# Patient Record
Sex: Female | Born: 1955 | Race: White | Hispanic: No | Marital: Married | Smoking: Never smoker
Health system: Southern US, Community
[De-identification: ages and names within clinical notes are randomized; demographics above are authoritative.]

## PROBLEM LIST (undated history)

## (undated) DIAGNOSIS — M5126 Other intervertebral disc displacement, lumbar region: Secondary | ICD-10-CM

## (undated) DIAGNOSIS — R112 Nausea with vomiting, unspecified: Secondary | ICD-10-CM

## (undated) DIAGNOSIS — R519 Headache, unspecified: Secondary | ICD-10-CM

## (undated) DIAGNOSIS — F329 Major depressive disorder, single episode, unspecified: Secondary | ICD-10-CM

## (undated) DIAGNOSIS — F419 Anxiety disorder, unspecified: Secondary | ICD-10-CM

## (undated) DIAGNOSIS — Z9889 Other specified postprocedural states: Secondary | ICD-10-CM

## (undated) DIAGNOSIS — R51 Headache: Secondary | ICD-10-CM

## (undated) DIAGNOSIS — T8859XA Other complications of anesthesia, initial encounter: Secondary | ICD-10-CM

## (undated) DIAGNOSIS — K219 Gastro-esophageal reflux disease without esophagitis: Secondary | ICD-10-CM

## (undated) DIAGNOSIS — T4145XA Adverse effect of unspecified anesthetic, initial encounter: Secondary | ICD-10-CM

## (undated) DIAGNOSIS — S83209A Unspecified tear of unspecified meniscus, current injury, unspecified knee, initial encounter: Secondary | ICD-10-CM

## (undated) DIAGNOSIS — M1990 Unspecified osteoarthritis, unspecified site: Secondary | ICD-10-CM

## (undated) DIAGNOSIS — N6310 Unspecified lump in the right breast, unspecified quadrant: Secondary | ICD-10-CM

## (undated) DIAGNOSIS — F32A Depression, unspecified: Secondary | ICD-10-CM

## (undated) HISTORY — PX: ABDOMINAL HYSTERECTOMY: SHX81

## (undated) HISTORY — PX: TONSILLECTOMY: SUR1361

## (undated) HISTORY — PX: COLONOSCOPY: SHX174

## (undated) HISTORY — PX: LAPAROSCOPY: SHX197

## (undated) HISTORY — PX: RHINOPLASTY: SUR1284

## (undated) HISTORY — PX: DILATION AND CURETTAGE OF UTERUS: SHX78

---

## 1997-12-07 ENCOUNTER — Other Ambulatory Visit: Admission: RE | Admit: 1997-12-07 | Discharge: 1997-12-07 | Payer: Self-pay | Admitting: Obstetrics & Gynecology

## 1999-08-16 ENCOUNTER — Other Ambulatory Visit: Admission: RE | Admit: 1999-08-16 | Discharge: 1999-08-16 | Payer: Self-pay | Admitting: Obstetrics & Gynecology

## 2000-08-29 ENCOUNTER — Other Ambulatory Visit: Admission: RE | Admit: 2000-08-29 | Discharge: 2000-08-29 | Payer: Self-pay | Admitting: Obstetrics & Gynecology

## 2000-08-31 ENCOUNTER — Ambulatory Visit (HOSPITAL_COMMUNITY): Admission: RE | Admit: 2000-08-31 | Discharge: 2000-08-31 | Payer: Self-pay | Admitting: Obstetrics & Gynecology

## 2000-08-31 ENCOUNTER — Encounter (INDEPENDENT_AMBULATORY_CARE_PROVIDER_SITE_OTHER): Payer: Self-pay

## 2001-09-22 ENCOUNTER — Other Ambulatory Visit: Admission: RE | Admit: 2001-09-22 | Discharge: 2001-09-22 | Payer: Self-pay | Admitting: Obstetrics & Gynecology

## 2003-01-12 ENCOUNTER — Other Ambulatory Visit: Admission: RE | Admit: 2003-01-12 | Discharge: 2003-01-12 | Payer: Self-pay | Admitting: Obstetrics & Gynecology

## 2004-04-20 ENCOUNTER — Other Ambulatory Visit: Admission: RE | Admit: 2004-04-20 | Discharge: 2004-04-20 | Payer: Self-pay | Admitting: Obstetrics & Gynecology

## 2007-08-04 ENCOUNTER — Encounter: Admission: RE | Admit: 2007-08-04 | Discharge: 2007-08-04 | Payer: Self-pay | Admitting: Obstetrics & Gynecology

## 2010-07-07 NOTE — Op Note (Signed)
[facility]  Patient:    Janet Wells, Janet Wells                           MRN: [id number] Proc. Date: 08/31/00 Adm. Date:  [id number] Attending:  Minette Headland                           Operative Report  PREOPERATIVE DIAGNOSIS:       Missed abortion.  POSTOPERATIVE DIAGNOSIS:      Missed abortion.  OPERATION:                    Dilation and evacuation.  SURGEON:                      Freddy Finner, M.D.  ANESTHESIA:                   Managed intravenous sedation and paracervical block.  INTRAOPERATIVE COMPLICATIONS:                None.  ESTIMATED INTRAOPERATIVE BLOOD LOSS:                   Less than or equal to 50 cc.  INDICATIONS:                  Patient is a 56 year old who presented to the office approximately 48 hours prior to this admission for routine obstetrical care at 10-1/2 weeks of gestation by dates.  No fetal heart tone was heard at the initial examination and a pelvic ultrasound was obtained which showed a nonviable 7.3 week size pregnancy with a fetus and no fetal heart activity. Patient is now admitted for D&E.  DESCRIPTION OF PROCEDURE:     She was admitted on the morning of surgery, brought to the operating room, placed under adequate intravenous sedation, placed in the dorsal lithotomy position using the [facility] stirrup system. Betadine prep of the perineum and vagina was carried out.  Cervix was visualized, grasped with a single-toothed tenaculum.  One percent plain Xylocaine 10 cc was used to inject at sites at the 4 and 8 oclock positions in the vaginal fornices for paracervical block.  Cervix was progressively dilated to 27 with Pratts.  An 8 mm curved suction cannula was introduced and aspiration preduced obvious obvious parts of conception.  This was continued until it was felt that the cavity was evacuated and this was confirmed by exploration with a small sharp curet and polyp forceps and repeat vacuum aspiration.   The procedure was terminated and the instruments were removed. The patient was taken to the recovery room in good condition and will be discharged per routine outpatient surgical orders to be followed up in approximately one week in the office. DD:  08/31/00 TD:  08/31/00 Job: [id number]

## 2012-01-31 ENCOUNTER — Other Ambulatory Visit: Payer: Self-pay | Admitting: Obstetrics & Gynecology

## 2013-12-07 ENCOUNTER — Other Ambulatory Visit: Payer: Self-pay | Admitting: Obstetrics & Gynecology

## 2013-12-08 LAB — CYTOLOGY - PAP

## 2013-12-16 ENCOUNTER — Other Ambulatory Visit: Payer: Self-pay | Admitting: Obstetrics & Gynecology

## 2013-12-16 DIAGNOSIS — R928 Other abnormal and inconclusive findings on diagnostic imaging of breast: Secondary | ICD-10-CM

## 2013-12-30 ENCOUNTER — Ambulatory Visit
Admission: RE | Admit: 2013-12-30 | Discharge: 2013-12-30 | Disposition: A | Discharge status: Home / Self Care | Payer: 59 | Source: Ambulatory Visit | Attending: Obstetrics & Gynecology | Admitting: Obstetrics & Gynecology

## 2013-12-30 DIAGNOSIS — R928 Other abnormal and inconclusive findings on diagnostic imaging of breast: Secondary | ICD-10-CM

## 2014-01-20 ENCOUNTER — Other Ambulatory Visit: Payer: Self-pay | Admitting: Surgical

## 2014-02-18 NOTE — Patient Instructions (Addendum)
Janet Wells  02/18/2014   Your procedure is scheduled on: 02/26/14   Report to [facility]  Entrance and follow signs to               [place] at 5:30 AM.   Call this number if you have problems the morning of surgery [phone]   Remember:  Do not eat food or drink liquids :After Midnight.     Take these medicines the morning of surgery with A SIP OF WATER: prilosec                               You may not have any metal on your body including hair pins and              piercings  Do not wear jewelry, make-up, lotions, powders or perfumes.             Do not wear nail polish.  Do not shave  48 hours prior to surgery.              Men may shave face and neck.   Do not bring valuables to the hospital. [place].  Contacts, dentures or bridgework may not be worn into surgery.  Leave suitcase in the car. After surgery it may be brought to your room.     Patients discharged the day of surgery will not be allowed to drive home.  Name and phone number of your driver:  Special Instructions: N/A              Please read over the following fact sheets you were given: _____________________________________________________________________                                                     [place]  Before surgery, you can play an important role.  Because skin is not sterile, your skin needs to be as free of germs as possible.  You can reduce the number of germs on your skin by washing with CHG (chlorahexidine gluconate) soap before surgery.  CHG is an antiseptic cleaner which kills germs and bonds with the skin to continue killing germs even after washing. Please DO NOT use if you have an allergy to CHG or antibacterial soaps.  If your skin becomes reddened/irritated stop using the CHG and inform your nurse when you arrive at Short Stay. Do not shave (including legs and  underarms) for at least 48 hours prior to the first CHG shower.  You may shave your face. Please follow these instructions carefully:   1.  Shower with CHG Soap the night before surgery and the  morning of Surgery.   2.  If you choose to wash your hair, wash your hair first as usual with your  normal  Shampoo.   3.  After you shampoo, rinse your hair and body thoroughly to remove the  shampoo.  4.  Use CHG as you would any other liquid soap.  You can apply chg directly  to the skin and wash . Gently wash with scrungie or clean wascloth    5.  Apply the CHG Soap to your body ONLY FROM THE NECK DOWN.   Do not use on open                           Wound or open sores. Avoid contact with eyes, ears mouth and genitals (private parts).                        Genitals (private parts) with your normal soap.              6.  Wash thoroughly, paying special attention to the area where your surgery  will be performed.   7.  Thoroughly rinse your body with warm water from the neck down.   8.  DO NOT shower/wash with your normal soap after using and rinsing off  the CHG Soap .                9.  Pat yourself dry with a clean towel.             10.  Wear clean pajamas.             11.  Place clean sheets on your bed the night of your first shower and do not  sleep with pets.  Day of Surgery : Do not apply any lotions/deodorants the morning of surgery.  Please wear clean clothes to the hospital/surgery center.  FAILURE TO FOLLOW THESE INSTRUCTIONS MAY RESULT IN THE CANCELLATION OF YOUR SURGERY    PATIENT SIGNATURE_________________________________  ______________________________________________________________________

## 2014-02-22 ENCOUNTER — Encounter (HOSPITAL_COMMUNITY): Payer: Self-pay

## 2014-02-22 ENCOUNTER — Encounter (HOSPITAL_COMMUNITY)
Admission: RE | Admit: 2014-02-22 | Discharge: 2014-02-22 | Disposition: A | Discharge status: Home / Self Care | Payer: 59 | Source: Ambulatory Visit | Attending: Orthopedic Surgery | Admitting: Orthopedic Surgery

## 2014-02-22 DIAGNOSIS — Z01812 Encounter for preprocedural laboratory examination: Secondary | ICD-10-CM | POA: Insufficient documentation

## 2014-02-22 HISTORY — DX: Anxiety disorder, unspecified: F41.9

## 2014-02-22 HISTORY — DX: Other complications of anesthesia, initial encounter: T88.59XA

## 2014-02-22 HISTORY — DX: Adverse effect of unspecified anesthetic, initial encounter: T41.45XA

## 2014-02-22 HISTORY — DX: Headache, unspecified: R51.9

## 2014-02-22 HISTORY — DX: Unspecified tear of unspecified meniscus, current injury, unspecified knee, initial encounter: S83.209A

## 2014-02-22 HISTORY — DX: Other intervertebral disc displacement, lumbar region: M51.26

## 2014-02-22 HISTORY — DX: Headache: R51

## 2014-02-22 HISTORY — DX: Depression, unspecified: F32.A

## 2014-02-22 HISTORY — DX: Major depressive disorder, single episode, unspecified: F32.9

## 2014-02-22 HISTORY — DX: Gastro-esophageal reflux disease without esophagitis: K21.9

## 2014-02-22 HISTORY — DX: Unspecified osteoarthritis, unspecified site: M19.90

## 2014-02-22 LAB — CBC
HEMATOCRIT: 36.9 % (ref 36.0–46.0)
Hemoglobin: 11.4 g/dL — ABNORMAL LOW (ref 12.0–15.0)
MCH: 27.5 pg (ref 26.0–34.0)
MCHC: 30.9 g/dL (ref 30.0–36.0)
MCV: 88.9 fL (ref 78.0–100.0)
Platelets: 256 10*3/uL (ref 150–400)
RBC: 4.15 MIL/uL (ref 3.87–5.11)
RDW: 13.3 % (ref 11.5–15.5)
WBC: 4.4 10*3/uL (ref 4.0–10.5)

## 2014-02-22 LAB — HCG, SERUM, QUALITATIVE: Preg, Serum: NEGATIVE

## 2014-02-26 ENCOUNTER — Ambulatory Visit (HOSPITAL_COMMUNITY): Payer: 59 | Admitting: Anesthesiology

## 2014-02-26 ENCOUNTER — Encounter (HOSPITAL_COMMUNITY): Payer: Self-pay | Admitting: *Deleted

## 2014-02-26 ENCOUNTER — Encounter (HOSPITAL_COMMUNITY)
Admission: RE | Disposition: A | Discharge status: Home / Self Care | Payer: Self-pay | Source: Ambulatory Visit | Attending: Orthopedic Surgery

## 2014-02-26 ENCOUNTER — Ambulatory Visit (HOSPITAL_COMMUNITY)
Admission: RE | Admit: 2014-02-26 | Discharge: 2014-02-26 | Disposition: A | Discharge status: Home / Self Care | Payer: 59 | Source: Ambulatory Visit | Attending: Orthopedic Surgery | Admitting: Orthopedic Surgery

## 2014-02-26 DIAGNOSIS — M25562 Pain in left knee: Secondary | ICD-10-CM | POA: Diagnosis present

## 2014-02-26 DIAGNOSIS — F419 Anxiety disorder, unspecified: Secondary | ICD-10-CM | POA: Insufficient documentation

## 2014-02-26 DIAGNOSIS — F329 Major depressive disorder, single episode, unspecified: Secondary | ICD-10-CM | POA: Diagnosis not present

## 2014-02-26 DIAGNOSIS — K219 Gastro-esophageal reflux disease without esophagitis: Secondary | ICD-10-CM | POA: Diagnosis not present

## 2014-02-26 DIAGNOSIS — M1990 Unspecified osteoarthritis, unspecified site: Secondary | ICD-10-CM | POA: Insufficient documentation

## 2014-02-26 DIAGNOSIS — S83207A Unspecified tear of unspecified meniscus, current injury, left knee, initial encounter: Secondary | ICD-10-CM | POA: Insufficient documentation

## 2014-02-26 DIAGNOSIS — Z885 Allergy status to narcotic agent status: Secondary | ICD-10-CM | POA: Insufficient documentation

## 2014-02-26 HISTORY — PX: KNEE ARTHROSCOPY WITH MEDIAL MENISECTOMY: SHX5651

## 2014-02-26 SURGERY — ARTHROSCOPY, KNEE, WITH MEDIAL MENISCECTOMY
Anesthesia: General | Site: Knee | Laterality: Left

## 2014-02-26 MED ORDER — LACTATED RINGERS IR SOLN
Status: DC | PRN
  Administered 2014-02-26 (×2): 3000 mL

## 2014-02-26 MED ORDER — ONDANSETRON HCL 4 MG/2ML IJ SOLN
INTRAMUSCULAR | Status: DC | PRN
  Administered 2014-02-26: 4 mg via INTRAVENOUS

## 2014-02-26 MED ORDER — BUPIVACAINE-EPINEPHRINE 0.25% -1:200000 IJ SOLN
INTRAMUSCULAR | Status: DC | PRN
  Administered 2014-02-26: 390 mL

## 2014-02-26 MED ORDER — LACTATED RINGERS IV SOLN
INTRAVENOUS | Status: DC
  Administered 2014-02-26: 07:00:00 via INTRAVENOUS

## 2014-02-26 MED ORDER — CEFAZOLIN SODIUM-DEXTROSE 2-3 GM-% IV SOLR
INTRAVENOUS | Status: AC
  Filled 2014-02-26: qty 50

## 2014-02-26 MED ORDER — ONDANSETRON HCL 4 MG/2ML IJ SOLN
INTRAMUSCULAR | Status: AC
  Filled 2014-02-26: qty 2

## 2014-02-26 MED ORDER — MIDAZOLAM HCL 2 MG/2ML IJ SOLN
INTRAMUSCULAR | Status: AC
  Filled 2014-02-26: qty 2

## 2014-02-26 MED ORDER — MIDAZOLAM HCL 5 MG/5ML IJ SOLN
INTRAMUSCULAR | Status: DC | PRN
  Administered 2014-02-26: 2 mg via INTRAVENOUS

## 2014-02-26 MED ORDER — PROMETHAZINE HCL 25 MG/ML IJ SOLN
6.2500 mg | INTRAMUSCULAR | Status: DC | PRN
  Administered 2014-02-26: 6.25 mg via INTRAVENOUS
  Filled 2014-02-26: qty 1

## 2014-02-26 MED ORDER — ONDANSETRON HCL 4 MG/2ML IJ SOLN
4.0000 mg | Freq: Once | INTRAMUSCULAR | Status: AC
  Administered 2014-02-26: 4 mg via INTRAVENOUS
  Filled 2014-02-26: qty 2

## 2014-02-26 MED ORDER — CEFAZOLIN SODIUM-DEXTROSE 2-3 GM-% IV SOLR
2.0000 g | INTRAVENOUS | Status: AC
  Administered 2014-02-26: 2 g via INTRAVENOUS

## 2014-02-26 MED ORDER — FENTANYL CITRATE 0.05 MG/ML IJ SOLN
INTRAMUSCULAR | Status: AC
  Filled 2014-02-26: qty 2

## 2014-02-26 MED ORDER — FENTANYL CITRATE 0.05 MG/ML IJ SOLN
INTRAMUSCULAR | Status: DC | PRN
  Administered 2014-02-26: 100 ug via INTRAVENOUS

## 2014-02-26 MED ORDER — LIDOCAINE HCL (CARDIAC) 20 MG/ML IV SOLN
INTRAVENOUS | Status: AC
  Filled 2014-02-26: qty 5

## 2014-02-26 MED ORDER — DEXAMETHASONE SODIUM PHOSPHATE 10 MG/ML IJ SOLN
INTRAMUSCULAR | Status: DC | PRN
  Administered 2014-02-26: 10 mg via INTRAVENOUS

## 2014-02-26 MED ORDER — BUPIVACAINE-EPINEPHRINE (PF) 0.25% -1:200000 IJ SOLN
INTRAMUSCULAR | Status: AC
  Filled 2014-02-26: qty 30

## 2014-02-26 MED ORDER — OXYCODONE-ACETAMINOPHEN 5-325 MG PO TABS: 1.0000 | ORAL_TABLET | ORAL | Status: DC | PRN

## 2014-02-26 MED ORDER — PROPOFOL 10 MG/ML IV BOLUS
INTRAVENOUS | Status: AC
  Filled 2014-02-26: qty 20

## 2014-02-26 MED ORDER — MEPERIDINE HCL 50 MG/ML IJ SOLN: 6.2500 mg | INTRAMUSCULAR | Status: DC | PRN

## 2014-02-26 MED ORDER — PROPOFOL 10 MG/ML IV BOLUS
INTRAVENOUS | Status: DC | PRN
  Administered 2014-02-26: 170 mg via INTRAVENOUS

## 2014-02-26 MED ORDER — OXYCODONE-ACETAMINOPHEN 10-325 MG PO TABS: 1.0000 | ORAL_TABLET | ORAL | Status: DC | PRN

## 2014-02-26 MED ORDER — DEXAMETHASONE SODIUM PHOSPHATE 10 MG/ML IJ SOLN
INTRAMUSCULAR | Status: AC
  Filled 2014-02-26: qty 1

## 2014-02-26 MED ORDER — HYDROMORPHONE HCL 1 MG/ML IJ SOLN
INTRAMUSCULAR | Status: AC
  Filled 2014-02-26: qty 1

## 2014-02-26 MED ORDER — BACITRACIN ZINC 500 UNIT/GM EX OINT
TOPICAL_OINTMENT | CUTANEOUS | Status: DC | PRN
  Administered 2014-02-26: 1 via TOPICAL

## 2014-02-26 MED ORDER — HYDROMORPHONE HCL 1 MG/ML IJ SOLN
0.2500 mg | INTRAMUSCULAR | Status: DC | PRN
  Administered 2014-02-26 (×4): 0.5 mg via INTRAVENOUS

## 2014-02-26 MED ORDER — BACITRACIN ZINC 500 UNIT/GM EX OINT
TOPICAL_OINTMENT | CUTANEOUS | Status: AC
  Filled 2014-02-26: qty 28.35

## 2014-02-26 MED ORDER — LIDOCAINE HCL (CARDIAC) 20 MG/ML IV SOLN
INTRAVENOUS | Status: DC | PRN
  Administered 2014-02-26: 100 mg via INTRAVENOUS

## 2014-02-26 SURGICAL SUPPLY — 31 items
BANDAGE ELASTIC 4 VELCRO ST LF (GAUZE/BANDAGES/DRESSINGS) ×3 IMPLANT
BANDAGE ELASTIC 6 VELCRO ST LF (GAUZE/BANDAGES/DRESSINGS) ×3 IMPLANT
BLADE GREAT WHITE 4.2 (BLADE) ×2 IMPLANT
BLADE GREAT WHITE 4.2MM (BLADE) ×1
BLADE SURG SZ11 CARB STEEL (BLADE) IMPLANT
BNDG COHESIVE 6X5 TAN STRL LF (GAUZE/BANDAGES/DRESSINGS) ×3 IMPLANT
BNDG GAUZE ELAST 4 BULKY (GAUZE/BANDAGES/DRESSINGS) ×3 IMPLANT
CLOTH BEACON ORANGE TIMEOUT ST (SAFETY) ×3 IMPLANT
COUNTER NEEDLE 20 DBL MAG RED (NEEDLE) ×3 IMPLANT
DRAPE SHEET LG 3/4 BI-LAMINATE (DRAPES) ×3 IMPLANT
DRSG ADAPTIC 3X8 NADH LF (GAUZE/BANDAGES/DRESSINGS) ×3 IMPLANT
DRSG PAD ABDOMINAL 8X10 ST (GAUZE/BANDAGES/DRESSINGS) IMPLANT
DURAPREP 26ML APPLICATOR (WOUND CARE) ×3 IMPLANT
GLOVE BIOGEL PI IND STRL 8 (GLOVE) ×1 IMPLANT
GLOVE BIOGEL PI INDICATOR 8 (GLOVE) ×2
GLOVE ECLIPSE 8.0 STRL XLNG CF (GLOVE) ×3 IMPLANT
GOWN STRL REUS W/TWL XL LVL3 (GOWN DISPOSABLE) ×3 IMPLANT
KIT BASIN OR (CUSTOM PROCEDURE TRAY) ×3 IMPLANT
MANIFOLD NEPTUNE II (INSTRUMENTS) ×3 IMPLANT
PACK ARTHROSCOPY WL (CUSTOM PROCEDURE TRAY) ×3 IMPLANT
PACK ICE MAXI GEL EZY WRAP (MISCELLANEOUS) ×3 IMPLANT
PAD ABD 8X10 STRL (GAUZE/BANDAGES/DRESSINGS) ×9 IMPLANT
PAD MASON LEG HOLDER (PIN) ×3 IMPLANT
SET ARTHROSCOPY TUBING (MISCELLANEOUS) ×2
SET ARTHROSCOPY TUBING LN (MISCELLANEOUS) ×1 IMPLANT
SUT ETHILON 3 0 PS 1 (SUTURE) ×3 IMPLANT
TOWEL OR 17X26 10 PK STRL BLUE (TOWEL DISPOSABLE) ×3 IMPLANT
TUBING CONNECTING 10 (TUBING) ×2 IMPLANT
TUBING CONNECTING 10' (TUBING) ×1
WAND 90 DEG TURBOVAC W/CORD (SURGICAL WAND) ×3 IMPLANT
WRAP KNEE MAXI GEL POST OP (GAUZE/BANDAGES/DRESSINGS) ×3 IMPLANT

## 2014-02-26 NOTE — H&P (Signed)
Janet Wells is an 59 y.o. female.   Chief Complaint: pain Left Knee HPI: Progressive pain in Left Knee  Past Medical History  Diagnosis Date  . Complication of anesthesia   . Headache     hx migraines  . Meniscus tear     l knee  . Ruptured lumbar disc   . Arthritis   . GERD (gastroesophageal reflux disease)   . Depression   . Anxiety     Past Surgical History  Procedure Laterality Date  . Tonsillectomy    . Rhinoplasty  1980's  . Laparoscopy      for endometriosis  . Cesarean section    . Dilation and curettage of uterus      x3  . Colonoscopy      History reviewed. No pertinent family history. Social History:  reports that she has never smoked. She does not have any smokeless tobacco history on file. She reports that she drinks alcohol. She reports that she does not use illicit drugs.  Allergies:  Allergies  Allergen Reactions  . Vicodin [Hydrocodone-Acetaminophen]     Mouth itch    Medications Prior to Admission  Medication Sig Dispense Refill  . aspirin-acetaminophen-caffeine (EXCEDRIN MIGRAINE) 250-250-65 MG per tablet Take 1 tablet by mouth every 6 (six) hours as needed for headache.    Marland Kitchen MINIVELLE 0.05 MG/24HR patch Place 1 patch onto the skin 2 (two) times a week.   12  . Multiple Vitamin (MULTIVITAMIN WITH MINERALS) TABS tablet Take 1 tablet by mouth daily.    . naproxen sodium (ANAPROX) 220 MG tablet Take 220 mg by mouth 2 (two) times daily as needed (for pain).    Marland Kitchen omeprazole (PRILOSEC) 20 MG capsule Take 20 mg by mouth daily.   11  . progesterone (PROMETRIUM) 200 MG capsule Take 200 mg by mouth every 3 (three) months.   0    No results found for this or any previous visit (from the past 48 hour(s)). No results found.  Review of Systems  Constitutional: Negative.   HENT: Negative.   Eyes: Negative.   Respiratory: Negative.   Cardiovascular: Negative.   Gastrointestinal: Negative.   Genitourinary: Negative.   Musculoskeletal: Positive for joint  pain.  Skin: Negative.   Neurological: Negative.   Endo/Heme/Allergies: Negative.   Psychiatric/Behavioral: Negative.     Blood pressure 145/75, pulse 82, temperature 98.3 F (36.8 C), temperature source Oral, resp. rate 18, height 5' 6.5" (1.689 m), weight 97.977 kg (216 lb), last menstrual period 01/06/2014, SpO2 100 %. Physical Exam  Constitutional: She appears well-developed.  HENT:  Head: Normocephalic.  Eyes: Pupils are equal, round, and reactive to light.  Neck: Normal range of motion.  Cardiovascular: Normal rate.   Respiratory: Effort normal.  GI: Soft.  Musculoskeletal:  Pain,Popping and Swelling of Left Knee.  Skin: Skin is warm.     Assessment/Plan Left Knee Menisectomy.  Aliviah Spain A 02/26/2014, 7:03 AM

## 2014-02-26 NOTE — Brief Op Note (Signed)
02/26/2014  8:12 AM  PATIENT:  Golden Hurter  59 y.o. female  PRE-OPERATIVE DIAGNOSIS:  left knee medial meniscus tear  POST-OPERATIVE DIAGNOSIS:  Primary Osteoarthritis of Left Knee.r  PROCEDURE:  Procedure(s): LEFT KNEE ARTHROSCOPY WITH ABRASION CHONDROPLASTY OF THE PATELLA  AND MEDIAL FEMORAL CHONDYLE MICROFRACTURE TECHNIQUE (Left)  SURGEON:  Surgeon(s) and Role:    * Tobi Bastos, MD - Primary    ASSISTANTS: OR Tech  ANESTHESIA:   general  EBL:  Total I/O In: 600 [I.V.:600] Out: -   BLOOD ADMINISTERED:none  DRAINS: none   LOCAL MEDICATIONS USED:  MARCAINE 20cc of 0.25%with Epinephrine   SPECIMEN:  No Specimen  DISPOSITION OF SPECIMEN:  N/A  COUNTS:  YES  TOURNIQUET:  * No tourniquets in log *  DICTATION: .Other Dictation: Dictation Number [id number]  PLAN OF CARE: Discharge to home after PACU  PATIENT DISPOSITION:  PACU - hemodynamically stable.   Delay start of Pharmacological VTE agent (>24hrs) due to surgical blood loss or risk of bleeding: yes

## 2014-02-26 NOTE — Interval H&P Note (Signed)
History and Physical Interval Note:  02/26/2014 7:06 AM  Janet Wells  has presented today for surgery, with the diagnosis of left knee medial meniscus tear  The various methods of treatment have been discussed with the patient and family. After consideration of risks, benefits and other options for treatment, the patient has consented to  Procedure(s): LEFT KNEE ARTHROSCOPY WITH MEDIAL MENISECTOMY (Left) as a surgical intervention .  The patient's history has been reviewed, patient examined, no change in status, stable for surgery.  I have reviewed the patient's chart and labs.  Questions were answered to the patient's satisfaction.     Avary Eichenberger A

## 2014-02-26 NOTE — Transfer of Care (Signed)
Immediate Anesthesia Transfer of Care Note  Patient: Janet Wells  Procedure(s) Performed: Procedure(s): LEFT KNEE ARTHROSCOPY WITH ABRASION CHONDROPLASTY OF THE PATELLA  AND MEDIAL FEMORAL CHONDYLE MICROFRACTURE TECHNIQUE (Left)  Patient Location: PACU  Anesthesia Type:General  Level of Consciousness: sedated  Airway & Oxygen Therapy: Patient Spontanous Breathing and Patient connected to face mask oxygen  Post-op Assessment: Report given to PACU RN and Post -op Vital signs reviewed and stable  Post vital signs: Reviewed and stable  Complications: No apparent anesthesia complications

## 2014-02-26 NOTE — Progress Notes (Signed)
Still slightly queezy. Small amy phenergan given to help w ride home

## 2014-02-26 NOTE — Anesthesia Preprocedure Evaluation (Signed)
Anesthesia Evaluation  Patient identified by MRN, date of birth, ID band Patient awake    Reviewed: Allergy & Precautions, NPO status , Patient's Chart, lab work & pertinent test results  Airway Mallampati: II  TM Distance: >3 FB Neck ROM: Full    Dental no notable dental hx.    Pulmonary neg pulmonary ROS,  breath sounds clear to auscultation  Pulmonary exam normal       Cardiovascular negative cardio ROS  Rhythm:Regular Rate:Normal     Neuro/Psych negative neurological ROS  negative psych ROS   GI/Hepatic Neg liver ROS, GERD-  ,  Endo/Other  negative endocrine ROS  Renal/GU negative Renal ROS     Musculoskeletal negative musculoskeletal ROS (+) Arthritis -,   Abdominal   Peds  Hematology negative hematology ROS (+)   Anesthesia Other Findings   Reproductive/Obstetrics negative OB ROS                             Anesthesia Physical Anesthesia Plan  ASA: II  Anesthesia Plan:    Post-op Pain Management:    Induction: Intravenous  Airway Management Planned:   Additional Equipment:   Intra-op Plan:   Post-operative Plan:   Informed Consent: I have reviewed the patients History and Physical, chart, labs and discussed the procedure including the risks, benefits and alternatives for the proposed anesthesia with the patient or authorized representative who has indicated his/her understanding and acceptance.   Dental advisory given  Plan Discussed with: CRNA  Anesthesia Plan Comments:         Anesthesia Quick Evaluation

## 2014-02-26 NOTE — Op Note (Signed)
NAMETYESHIA, CORNFORTH                  ACCOUNT NO.:  [id number]  MEDICAL RECORD NO.:  [id number]  LOCATION:  [facility]                         FACILITY:  [facility]  PHYSICIAN:  Kipp Brood. Galya Dunnigan, M.D.DATE OF BIRTH:  December 09, 1955  DATE OF PROCEDURE:  02/26/2014 DATE OF DISCHARGE:                              OPERATIVE REPORT   PREOPERATIVE DIAGNOSES: 1. Primary osteoarthritis, left knee. 2. Rule out possibility of torn medial meniscus, left knee.  POSTOPERATIVE DIAGNOSES:  Primary osteoarthritis of the left knee.  OPERATION: 1. Diagnostic arthroscopy, left knee. 2. Abrasion chondroplasty, medial femoral condyle, left knee. 3. Microfracture technique, medial femoral condyle, left knee. 4. Abrasion chondroplasty of the patella, left knee. 5. Synovectomy suprapatellar pouch, left knee.  DESCRIPTION OF PROCEDURE:  Under general anesthesia, routine orthopedic prep and draping of the left lower extremity was carried out with the left leg in a knee holder.  The appropriate time-out was called out first and also marked the appropriate left leg in the holding area. Following that, a small incision was made in suprapatellar pouch. Inflow cannula was inserted.  Knee was distended with saline.  Another small punctate incision made in the anterolateral joint.  The arthroscope was entered from lateral approach and a complete diagnostic arthroscopy was carried out.  The lateral joint looked fine.  There were no signs of any meniscal tears.  Cruciates were intact.  I probed the medial meniscus, it was intact.  The main issue that we shown really on the MRI but she had a large flap of articular cartilage of the distal femur, medial femoral condyle, literally hanging off like orange peel. I simply did an abrasion chondroplasty across that area.  I then utilized a microfracture technique with several holes made in the bone with the instrument.  Following that, I went up into the suprapatellar pouch, did a  synovectomy, and then noted a significant chondromalacia of the patella.  I did an abrasion chondroplasty of the patella as well as synovectomy.  I thoroughly irrigated out the knee, removed the fluid, and closed all 3 punctate incisions with 3-0 nylon suture.  Sterile Neosporin dressings were applied after I injected 20 mL it should be of 0.25% Marcaine with epinephrine in knee joint.  Postop, she will be on aspirin 2 a day starting today as an anticoagulant.  We will keep a close eye on her as far as her postop activities and she will be on Percocet 10/325 for pain and crutches.  Partial weightbearing as tolerated.         ______________________________ Kipp Brood. Gladstone Lighter, M.D.    RAG/MEDQ  D:  02/26/2014  T:  02/26/2014  Job:  [id number]

## 2014-02-26 NOTE — Progress Notes (Signed)
Not much relief of nausea call to dr Lissa Hoard for PRN

## 2014-02-26 NOTE — Progress Notes (Signed)
States feeling a little better, but not completely relieved. Does not want additional meds at present

## 2014-02-26 NOTE — Anesthesia Postprocedure Evaluation (Signed)
Anesthesia Post Note  Patient: OTHA RICKLES  Procedure(s) Performed: Procedure(s) (LRB): LEFT KNEE ARTHROSCOPY WITH ABRASION CHONDROPLASTY OF THE PATELLA  AND MEDIAL FEMORAL CHONDYLE MICROFRACTURE TECHNIQUE (Left)  Anesthesia type: General  Patient location: PACU  Post pain: Pain level controlled  Post assessment: Post-op Vital signs reviewed  Last Vitals: BP 112/59 mmHg  Pulse 61  Temp(Src) 37 C (Oral)  Resp 18  Ht 5' 6.5" (1.689 m)  Wt 216 lb (97.977 kg)  BMI 34.35 kg/m2  SpO2 100%  LMP 01/06/2014 (Approximate)  Post vital signs: Reviewed  Level of consciousness: sedated  Complications: No apparent anesthesia complications

## 2014-02-26 NOTE — Progress Notes (Signed)
Patient began w waves of nausea. Cool cloth to forehead and alcohol swap to nose.

## 2014-03-01 ENCOUNTER — Encounter (HOSPITAL_COMMUNITY): Payer: Self-pay | Admitting: Orthopedic Surgery

## 2014-12-27 ENCOUNTER — Other Ambulatory Visit: Payer: Self-pay | Admitting: Obstetrics & Gynecology

## 2014-12-27 DIAGNOSIS — R928 Other abnormal and inconclusive findings on diagnostic imaging of breast: Secondary | ICD-10-CM

## 2015-01-03 ENCOUNTER — Other Ambulatory Visit: Payer: Self-pay | Admitting: Obstetrics & Gynecology

## 2015-01-03 ENCOUNTER — Ambulatory Visit
Admission: RE | Admit: 2015-01-03 | Discharge: 2015-01-03 | Disposition: A | Discharge status: Home / Self Care | Payer: 59 | Source: Ambulatory Visit | Attending: Obstetrics & Gynecology | Admitting: Obstetrics & Gynecology

## 2015-01-03 DIAGNOSIS — N6002 Solitary cyst of left breast: Secondary | ICD-10-CM

## 2015-01-03 DIAGNOSIS — R928 Other abnormal and inconclusive findings on diagnostic imaging of breast: Secondary | ICD-10-CM

## 2015-12-21 HISTORY — PX: BREAST BIOPSY: SHX20

## 2016-01-05 ENCOUNTER — Other Ambulatory Visit: Payer: Self-pay | Admitting: Obstetrics & Gynecology

## 2016-01-05 DIAGNOSIS — R928 Other abnormal and inconclusive findings on diagnostic imaging of breast: Secondary | ICD-10-CM

## 2016-01-16 ENCOUNTER — Ambulatory Visit
Admission: RE | Admit: 2016-01-16 | Discharge: 2016-01-16 | Disposition: A | Discharge status: Home / Self Care | Payer: Managed Care, Other (non HMO) | Source: Ambulatory Visit | Attending: Obstetrics & Gynecology | Admitting: Obstetrics & Gynecology

## 2016-01-16 ENCOUNTER — Other Ambulatory Visit: Payer: Self-pay | Admitting: Obstetrics & Gynecology

## 2016-01-16 DIAGNOSIS — R229 Localized swelling, mass and lump, unspecified: Principal | ICD-10-CM

## 2016-01-16 DIAGNOSIS — R928 Other abnormal and inconclusive findings on diagnostic imaging of breast: Secondary | ICD-10-CM

## 2016-01-16 DIAGNOSIS — IMO0002 Reserved for concepts with insufficient information to code with codable children: Secondary | ICD-10-CM

## 2016-01-16 DIAGNOSIS — N6489 Other specified disorders of breast: Secondary | ICD-10-CM

## 2016-01-17 ENCOUNTER — Ambulatory Visit
Admission: RE | Admit: 2016-01-17 | Discharge: 2016-01-17 | Disposition: A | Discharge status: Home / Self Care | Payer: Managed Care, Other (non HMO) | Source: Ambulatory Visit | Attending: Obstetrics & Gynecology | Admitting: Obstetrics & Gynecology

## 2016-01-17 DIAGNOSIS — N6489 Other specified disorders of breast: Secondary | ICD-10-CM

## 2016-02-01 ENCOUNTER — Ambulatory Visit: Payer: Self-pay | Admitting: General Surgery

## 2016-02-01 DIAGNOSIS — N6021 Fibroadenosis of right breast: Secondary | ICD-10-CM

## 2016-02-20 HISTORY — PX: BREAST EXCISIONAL BIOPSY: SUR124

## 2016-03-01 ENCOUNTER — Other Ambulatory Visit: Payer: Self-pay | Admitting: General Surgery

## 2016-03-01 DIAGNOSIS — N6021 Fibroadenosis of right breast: Secondary | ICD-10-CM

## 2016-03-06 ENCOUNTER — Encounter (HOSPITAL_BASED_OUTPATIENT_CLINIC_OR_DEPARTMENT_OTHER): Payer: Self-pay | Admitting: *Deleted

## 2016-03-09 ENCOUNTER — Ambulatory Visit
Admission: RE | Admit: 2016-03-09 | Discharge: 2016-03-09 | Disposition: A | Discharge status: Home / Self Care | Payer: Managed Care, Other (non HMO) | Source: Ambulatory Visit | Attending: General Surgery | Admitting: General Surgery

## 2016-03-09 DIAGNOSIS — N6021 Fibroadenosis of right breast: Secondary | ICD-10-CM

## 2016-03-09 NOTE — Pre-Procedure Instructions (Signed)
Boost Breeze 8 oz. given to pt. with instructions to drink by 0730 DOS; pt. voiced understanding. 

## 2016-03-12 ENCOUNTER — Ambulatory Visit (HOSPITAL_BASED_OUTPATIENT_CLINIC_OR_DEPARTMENT_OTHER)
Admission: RE | Admit: 2016-03-12 | Discharge: 2016-03-12 | Disposition: A | Discharge status: Home / Self Care | Payer: Managed Care, Other (non HMO) | Source: Ambulatory Visit | Attending: General Surgery | Admitting: General Surgery

## 2016-03-12 ENCOUNTER — Ambulatory Visit (HOSPITAL_BASED_OUTPATIENT_CLINIC_OR_DEPARTMENT_OTHER): Payer: Managed Care, Other (non HMO) | Admitting: Certified Registered"

## 2016-03-12 ENCOUNTER — Encounter (HOSPITAL_BASED_OUTPATIENT_CLINIC_OR_DEPARTMENT_OTHER)
Admission: RE | Disposition: A | Discharge status: Home / Self Care | Payer: Self-pay | Source: Ambulatory Visit | Attending: General Surgery

## 2016-03-12 ENCOUNTER — Ambulatory Visit
Admission: RE | Admit: 2016-03-12 | Discharge: 2016-03-12 | Disposition: A | Discharge status: Home / Self Care | Payer: Managed Care, Other (non HMO) | Source: Ambulatory Visit | Attending: General Surgery | Admitting: General Surgery

## 2016-03-12 DIAGNOSIS — N6021 Fibroadenosis of right breast: Secondary | ICD-10-CM

## 2016-03-12 DIAGNOSIS — N6310 Unspecified lump in the right breast, unspecified quadrant: Secondary | ICD-10-CM | POA: Diagnosis present

## 2016-03-12 DIAGNOSIS — D225 Melanocytic nevi of trunk: Secondary | ICD-10-CM | POA: Diagnosis not present

## 2016-03-12 HISTORY — DX: Unspecified lump in the right breast, unspecified quadrant: N63.10

## 2016-03-12 HISTORY — PX: BREAST LUMPECTOMY WITH RADIOACTIVE SEED LOCALIZATION: SHX6424

## 2016-03-12 HISTORY — DX: Nausea with vomiting, unspecified: R11.2

## 2016-03-12 HISTORY — DX: Other specified postprocedural states: Z98.890

## 2016-03-12 SURGERY — BREAST LUMPECTOMY WITH RADIOACTIVE SEED LOCALIZATION
Anesthesia: General | Site: Breast | Laterality: Right

## 2016-03-12 MED ORDER — FENTANYL CITRATE (PF) 100 MCG/2ML IJ SOLN
25.0000 ug | INTRAMUSCULAR | Status: DC | PRN
  Administered 2016-03-12 (×2): 50 ug via INTRAVENOUS

## 2016-03-12 MED ORDER — LACTATED RINGERS IV SOLN
INTRAVENOUS | Status: DC
  Administered 2016-03-12 (×2): via INTRAVENOUS

## 2016-03-12 MED ORDER — BUPIVACAINE HCL (PF) 0.25 % IJ SOLN
INTRAMUSCULAR | Status: DC | PRN
  Administered 2016-03-12: 10 mL

## 2016-03-12 MED ORDER — CHLORHEXIDINE GLUCONATE CLOTH 2 % EX PADS: 6.0000 | MEDICATED_PAD | Freq: Once | CUTANEOUS | Status: DC

## 2016-03-12 MED ORDER — PROPOFOL 10 MG/ML IV BOLUS
INTRAVENOUS | Status: DC | PRN
  Administered 2016-03-12: 200 mg via INTRAVENOUS

## 2016-03-12 MED ORDER — CEFAZOLIN SODIUM-DEXTROSE 2-4 GM/100ML-% IV SOLN
2.0000 g | INTRAVENOUS | Status: AC
  Administered 2016-03-12: 2 g via INTRAVENOUS

## 2016-03-12 MED ORDER — HYDROCODONE-ACETAMINOPHEN 7.5-325 MG PO TABS: 1.0000 | ORAL_TABLET | Freq: Once | ORAL | Status: DC | PRN

## 2016-03-12 MED ORDER — FENTANYL CITRATE (PF) 100 MCG/2ML IJ SOLN
INTRAMUSCULAR | Status: AC
  Filled 2016-03-12: qty 2

## 2016-03-12 MED ORDER — SCOPOLAMINE 1 MG/3DAYS TD PT72
MEDICATED_PATCH | TRANSDERMAL | Status: AC
  Filled 2016-03-12: qty 1

## 2016-03-12 MED ORDER — DEXAMETHASONE SODIUM PHOSPHATE 4 MG/ML IJ SOLN
INTRAMUSCULAR | Status: DC | PRN
  Administered 2016-03-12: 10 mg via INTRAVENOUS

## 2016-03-12 MED ORDER — ONDANSETRON HCL 4 MG/2ML IJ SOLN
INTRAMUSCULAR | Status: DC | PRN
  Administered 2016-03-12: 4 mg via INTRAVENOUS

## 2016-03-12 MED ORDER — FENTANYL CITRATE (PF) 100 MCG/2ML IJ SOLN
50.0000 ug | INTRAMUSCULAR | Status: DC | PRN
  Administered 2016-03-12 (×2): 50 ug via INTRAVENOUS

## 2016-03-12 MED ORDER — BUPIVACAINE-EPINEPHRINE (PF) 0.5% -1:200000 IJ SOLN
INTRAMUSCULAR | Status: AC
  Filled 2016-03-12: qty 30

## 2016-03-12 MED ORDER — MEPERIDINE HCL 25 MG/ML IJ SOLN: 6.2500 mg | INTRAMUSCULAR | Status: DC | PRN

## 2016-03-12 MED ORDER — MIDAZOLAM HCL 2 MG/2ML IJ SOLN
INTRAMUSCULAR | Status: AC
Start: 2016-03-12 — End: 2016-03-12
  Filled 2016-03-12: qty 2

## 2016-03-12 MED ORDER — TRAMADOL HCL 50 MG PO TABS: 50.0000 mg | ORAL_TABLET | Freq: Four times a day (QID) | ORAL | 1 refills | Status: AC | PRN

## 2016-03-12 MED ORDER — CEFAZOLIN SODIUM-DEXTROSE 2-4 GM/100ML-% IV SOLN
INTRAVENOUS | Status: AC
  Filled 2016-03-12: qty 100

## 2016-03-12 MED ORDER — MIDAZOLAM HCL 2 MG/2ML IJ SOLN
1.0000 mg | INTRAMUSCULAR | Status: DC | PRN
  Administered 2016-03-12: 2 mg via INTRAVENOUS

## 2016-03-12 MED ORDER — PROMETHAZINE HCL 25 MG/ML IJ SOLN: 6.2500 mg | INTRAMUSCULAR | Status: DC | PRN

## 2016-03-12 MED ORDER — SCOPOLAMINE 1 MG/3DAYS TD PT72
1.0000 | MEDICATED_PATCH | Freq: Once | TRANSDERMAL | Status: DC | PRN
  Administered 2016-03-12: 1.5 mg via TRANSDERMAL

## 2016-03-12 MED ORDER — LIDOCAINE HCL (CARDIAC) 20 MG/ML IV SOLN
INTRAVENOUS | Status: DC | PRN
  Administered 2016-03-12: 60 mg via INTRAVENOUS

## 2016-03-12 MED ORDER — BUPIVACAINE HCL (PF) 0.25 % IJ SOLN
INTRAMUSCULAR | Status: AC
  Filled 2016-03-12: qty 30

## 2016-03-12 SURGICAL SUPPLY — 40 items
APPLIER CLIP 9.375 MED OPEN (MISCELLANEOUS)
BLADE SURG 15 STRL LF DISP TIS (BLADE) ×1 IMPLANT
BLADE SURG 15 STRL SS (BLADE) ×2
CANISTER SUC SOCK COL 7IN (MISCELLANEOUS) ×3 IMPLANT
CANISTER SUCT 1200ML W/VALVE (MISCELLANEOUS) ×3 IMPLANT
CHLORAPREP W/TINT 26ML (MISCELLANEOUS) ×3 IMPLANT
CLIP APPLIE 9.375 MED OPEN (MISCELLANEOUS) IMPLANT
COVER BACK TABLE 60X90IN (DRAPES) ×3 IMPLANT
COVER MAYO STAND STRL (DRAPES) ×3 IMPLANT
COVER PROBE W GEL 5X96 (DRAPES) ×3 IMPLANT
DECANTER SPIKE VIAL GLASS SM (MISCELLANEOUS) IMPLANT
DERMABOND ADVANCED (GAUZE/BANDAGES/DRESSINGS) ×2
DERMABOND ADVANCED .7 DNX12 (GAUZE/BANDAGES/DRESSINGS) ×1 IMPLANT
DEVICE DUBIN W/COMP PLATE 8390 (MISCELLANEOUS) ×6 IMPLANT
DRAPE LAPAROSCOPIC ABDOMINAL (DRAPES) IMPLANT
DRAPE UTILITY XL STRL (DRAPES) ×3 IMPLANT
ELECT COATED BLADE 2.86 ST (ELECTRODE) ×3 IMPLANT
ELECT REM PT RETURN 9FT ADLT (ELECTROSURGICAL) ×3
ELECTRODE REM PT RTRN 9FT ADLT (ELECTROSURGICAL) ×1 IMPLANT
GLOVE BIO SURGEON STRL SZ7.5 (GLOVE) ×6 IMPLANT
GOWN STRL REUS W/ TWL LRG LVL3 (GOWN DISPOSABLE) ×2 IMPLANT
GOWN STRL REUS W/TWL LRG LVL3 (GOWN DISPOSABLE) ×4
ILLUMINATOR WAVEGUIDE N/F (MISCELLANEOUS) ×3 IMPLANT
KIT MARKER MARGIN INK (KITS) ×3 IMPLANT
LIGHT WAVEGUIDE WIDE FLAT (MISCELLANEOUS) IMPLANT
NEEDLE HYPO 25X1 1.5 SAFETY (NEEDLE) ×3 IMPLANT
NS IRRIG 1000ML POUR BTL (IV SOLUTION) ×3 IMPLANT
PACK BASIN DAY SURGERY FS (CUSTOM PROCEDURE TRAY) ×3 IMPLANT
PENCIL BUTTON HOLSTER BLD 10FT (ELECTRODE) ×3 IMPLANT
SLEEVE SCD COMPRESS KNEE MED (MISCELLANEOUS) ×3 IMPLANT
SPONGE LAP 18X18 X RAY DECT (DISPOSABLE) ×3 IMPLANT
SUT MON AB 4-0 PC3 18 (SUTURE) ×3 IMPLANT
SUT SILK 2 0 SH (SUTURE) IMPLANT
SUT VICRYL 3-0 CR8 SH (SUTURE) ×3 IMPLANT
SYR CONTROL 10ML LL (SYRINGE) ×3 IMPLANT
TOWEL OR 17X24 6PK STRL BLUE (TOWEL DISPOSABLE) ×3 IMPLANT
TOWEL OR NON WOVEN STRL DISP B (DISPOSABLE) IMPLANT
TUBE CONNECTING 20'X1/4 (TUBING) ×1
TUBE CONNECTING 20X1/4 (TUBING) ×2 IMPLANT
YANKAUER SUCT BULB TIP NO VENT (SUCTIONS) ×3 IMPLANT

## 2016-03-12 NOTE — Anesthesia Preprocedure Evaluation (Signed)
Anesthesia Evaluation  Patient identified by MRN, date of birth, ID band Patient awake    Reviewed: Allergy & Precautions, NPO status , Patient's Chart, lab work & pertinent test results  History of Anesthesia Complications (+) PONV and history of anesthetic complications  Airway Mallampati: II  TM Distance: >3 FB Neck ROM: Full    Dental no notable dental hx.    Pulmonary neg pulmonary ROS,    Pulmonary exam normal breath sounds clear to auscultation       Cardiovascular negative cardio ROS Normal cardiovascular exam Rhythm:Regular Rate:Normal     Neuro/Psych  Headaches, PSYCHIATRIC DISORDERS Anxiety    GI/Hepatic Neg liver ROS, GERD  ,  Endo/Other  negative endocrine ROS  Renal/GU negative Renal ROS     Musculoskeletal negative musculoskeletal ROS (+) Arthritis ,   Abdominal   Peds  Hematology negative hematology ROS (+)   Anesthesia Other Findings   Reproductive/Obstetrics negative OB ROS                             Anesthesia Physical Anesthesia Plan  ASA: II  Anesthesia Plan: General   Post-op Pain Management:    Induction: Intravenous  Airway Management Planned: LMA  Additional Equipment:   Intra-op Plan:   Post-operative Plan: Extubation in OR  Informed Consent: I have reviewed the patients History and Physical, chart, labs and discussed the procedure including the risks, benefits and alternatives for the proposed anesthesia with the patient or authorized representative who has indicated his/her understanding and acceptance.   Dental advisory given  Plan Discussed with: CRNA  Anesthesia Plan Comments:         Anesthesia Quick Evaluation

## 2016-03-12 NOTE — Anesthesia Procedure Notes (Signed)
Procedure Name: LMA Insertion Date/Time: 03/12/2016 10:50 AM Performed by: Laverle Pillard D Pre-anesthesia Checklist: Patient identified, Emergency Drugs available, Suction available and Patient being monitored Patient Re-evaluated:Patient Re-evaluated prior to inductionOxygen Delivery Method: Circle system utilized Preoxygenation: Pre-oxygenation with 100% oxygen Intubation Type: IV induction Ventilation: Mask ventilation without difficulty LMA: LMA inserted LMA Size: 4.0 Number of attempts: 1 Airway Equipment and Method: Bite block Placement Confirmation: positive ETCO2 Tube secured with: Tape Dental Injury: Teeth and Oropharynx as per pre-operative assessment

## 2016-03-12 NOTE — Transfer of Care (Signed)
Immediate Anesthesia Transfer of Care Note  Patient: Janet Wells  Procedure(s) Performed: Procedure(s): RIGHT BREAST LUMPECTOMY WITH RADIOACTIVE SEED LOCALIZATION x2, Removal Right Breast Mole (Right)  Patient Location: PACU  Anesthesia Type:General  Level of Consciousness: awake and patient cooperative  Airway & Oxygen Therapy: Patient Spontanous Breathing and Patient connected to face mask oxygen  Post-op Assessment: Report given to RN and Post -op Vital signs reviewed and stable  Post vital signs: Reviewed and stable  Last Vitals:  Vitals:   03/12/16 0935  BP: 137/81  Pulse: 77  Resp: 18  Temp: 36.7 C    Last Pain:  Vitals:   03/12/16 1002  PainSc: 0-No pain         Complications: No apparent anesthesia complications

## 2016-03-12 NOTE — Op Note (Signed)
03/12/2016  11:50 AM  PATIENT:  Janet Wells  61 y.o. female  PRE-OPERATIVE DIAGNOSIS:  RIGHT BREAST COMPLEX SCLEROSING LESION AND DISCORDANCE AND MOLE  POST-OPERATIVE DIAGNOSIS:  RIGHT BREAST COMPLEX SCLEROSING LESION AND DISCORDANCE AND MOLE  PROCEDURE:  Procedure(s): RIGHT BREAST LUMPECTOMY WITH RADIOACTIVE SEED LOCALIZATION x2, Removal Right Breast Mole (Right)  SURGEON:  Surgeon(s) and Role:    * Jovita Kussmaul, MD - Primary  PHYSICIAN ASSISTANT:   ASSISTANTS: none   ANESTHESIA:   local and general  EBL:  Total I/O In: 1200 [I.V.:1200] Out: -   BLOOD ADMINISTERED:none  DRAINS: none   LOCAL MEDICATIONS USED:  MARCAINE     SPECIMEN:  Source of Specimen:  right breast tissue X 2 and mole  DISPOSITION OF SPECIMEN:  PATHOLOGY  COUNTS:  YES  TOURNIQUET:  * No tourniquets in log *  DICTATION: .Dragon Dictation   After informed consent was obtained the patient was brought to the operating room and placed in the supine position on the operating room table. After adequate induction of general anesthesia the patient's right breast was prepped with ChloraPrep, allowed to dry, and draped in usual sterile manner. An appropriate timeout was performed. There was a mole on the upper portion of the right breast. This area was infiltrated with quarter percent Marcaine. A small elliptical incision was made around the mole with a 15 blade knife through the skin and subcutaneous tissue. The mole was completely removed and sent to pathology for further evaluation. Hemostasis was achieved using the Bovie electrocautery. The incision was then closed with interrupted 4-0 Monocryl subcuticular stitches. Attention was then turned to the outer and lower outer right breast where the radioactive seeds were placed. These were placed to mark areas of a complex sclerosing lesion and made an area of discordance. The neoprobe was set to I-125 in the area of radioactivity was readily identified. A  curvilinear incision was made along the outer aspect of the areola on the right breast. The incision was carried through the skin and subcutaneous tissue sharply with electrocautery. Dissection was then carried out towards the 2 radioactive seeds using the neoprobe to direct this dissection. Once we approached the 2 seeds each of the areas was excised sharply with the electrocautery. Each circular portion of breast tissue that was removed was oriented with the appropriate paint colors. A specimen radiograph was obtained on each of the 2 that showed the clip and seed to be within each of the specimens. The specimens were then sent to pathology for further evaluation. Hemostasis was achieved using the Bovie electrocautery. The wounds were irrigated with saline and infiltrated with quarter percent Marcaine. The deep layer of the wound was then closed with layers of interrupted 3-0 Vicryl stitches. The skin was then closed with interrupted 4-0 Monocryl subcuticular stitches. Dermabond dressings were applied. The patient tolerated the procedure well. At the end of the case all needle sponge and instrument counts were correct. The patient was then awakened and taken to recovery in stable condition.  PLAN OF CARE: Discharge to home after PACU  PATIENT DISPOSITION:  PACU - hemodynamically stable.   Delay start of Pharmacological VTE agent (>24hrs) due to surgical blood loss or risk of bleeding: not applicable

## 2016-03-12 NOTE — H&P (Signed)
Janet Wells  Location: [facility] Patient #: [id number] DOB: [birth year] Married / Language: English / Race: White Female   History of Present Illness  The patient is a 61 year old female who presents with a complaint of breast mass. We are asked to see the patient in consultation by Dr. Dory Horn to evaluate her for an abnormality in the right breast. The patient is a 61 year old white female who recently went for a routine screening mammogram. At that time she was found to have 2 abnormalities in the right breast. The lateral abnormality was biopsied and came back as a complex sclerosing lesion and measured about 1 cm. 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 The radiologist thought this was discordant. She denies any significant breast pain. She denies any discharge from the nipple. She has no family history of breast cancer. She did have a hysterectomy with removal of both ovaries in January of this year. She does take some hormone replacement. She does not smoke.   Diagnostic Studies History Colonoscopy  1-5 years ago  Allergies  Vicodin *ANALGESICS - OPIOID*  Hives.  Medication History Multi-Minerals (Oral daily) Active. Prometrium (200MG  Capsule, Oral daily) Active. Medications Reconciled    Review of Systems General Present- Weight Gain. Not Present- Appetite Loss, Chills, Fatigue, Fever, Night Sweats and Weight Loss. Skin Present- Dryness. Not Present- Change in Wart/Mole, Hives, Jaundice, New Lesions, Non-Healing Wounds, Rash and Ulcer. HEENT Present- Nose Bleed, Seasonal Allergies, Visual Disturbances and Wears glasses/contact lenses. Not Present- Earache, Hearing Loss, Hoarseness, Oral Ulcers, Ringing in the Ears, Sinus Pain, Sore Throat and Yellow Eyes. Respiratory Not Present- Bloody sputum, Chronic Cough, Difficulty Breathing,  Snoring and Wheezing. Cardiovascular Present- Palpitations and Swelling of Extremities. Not Present- Chest Pain, Difficulty Breathing Lying Down, Leg Cramps, Rapid Heart Rate and Shortness of Breath. Gastrointestinal Present- Hemorrhoids. Not Present- Abdominal Pain, Bloating, Bloody Stool, Change in Bowel Habits, Chronic diarrhea, Constipation, Difficulty Swallowing, Excessive gas, Gets full quickly at meals, Indigestion, Nausea, Rectal Pain and Vomiting. Female Genitourinary Not Present- Blood in Urine, Change in Urinary Stream, Frequency, Impotence, Nocturia, Painful Urination, Urgency and Urine Leakage. Musculoskeletal Present- Back Pain and Joint Pain. Not Present- Joint Stiffness, Muscle Pain, Muscle Weakness and Swelling of Extremities. Neurological Present- Headaches and Tingling. Not Present- Decreased Memory, Fainting, Numbness, Seizures, Tremor, Trouble walking and Weakness. Psychiatric Present- Anxiety and Frequent crying. Not Present- Bipolar, Change in Sleep Pattern, Depression and Fearful. Endocrine Not Present- Cold Intolerance, Excessive Hunger, Hair Changes, Heat Intolerance, Hot flashes and New Diabetes. Hematology Not Present- Blood Thinners, Easy Bruising, Excessive bleeding, Gland problems, HIV and Persistent Infections.  Vitals  Weight: 213.2 lb Height: 66.5in Body Surface Area: 2.07 m Body Mass Index: 33.9 kg/m  Pulse: 83 (Regular)  BP: 142/82 (Sitting, Left Arm, Standard)       Physical Exam  General Mental Status-Alert. General Appearance-Consistent with stated age. Hydration-Well hydrated. Voice-Normal.  Head and Neck Head-normocephalic, atraumatic with no lesions or palpable masses. Trachea-midline. Thyroid Gland Characteristics - normal size and consistency.  Eye Eyeball - Bilateral-Extraocular movements intact. Sclera/Conjunctiva - Bilateral-No scleral icterus.  Chest and Lung Exam Chest and lung exam reveals -quiet, even  and easy respiratory effort with no use of accessory muscles and on auscultation, normal breath sounds, no adventitious sounds and normal vocal resonance. Inspection Chest Wall - Normal. Back - normal.  Breast Note: There is no  palpable mass in either breast. There is no palpable axillary, supraclavicular, or cervical lymphadenopathy. She does have a small mole on the upper outer right breast that she would like to have removed.   Cardiovascular Cardiovascular examination reveals -normal heart sounds, regular rate and rhythm with no murmurs and normal pedal pulses bilaterally.  Abdomen Inspection Inspection of the abdomen reveals - No Hernias. Skin - Scar - no surgical scars. Palpation/Percussion Palpation and Percussion of the abdomen reveal - Soft, Non Tender, No Rebound tenderness, No Rigidity (guarding) and No hepatosplenomegaly. Auscultation Auscultation of the abdomen reveals - Bowel sounds normal.  Neurologic Neurologic evaluation reveals -alert and oriented x 3 with no impairment of recent or remote memory. Mental Status-Normal.  Musculoskeletal Normal Exam - Left-Upper Extremity Strength Normal and Lower Extremity Strength Normal. Normal Exam - Right-Upper Extremity Strength Normal and Lower Extremity Strength Normal.  Lymphatic Head & Neck  General Head & Neck Lymphatics: Bilateral - Description - Normal. Axillary  General Axillary Region: Bilateral - Description - Normal. Tenderness - Non Tender. Femoral & Inguinal  Generalized Femoral & Inguinal Lymphatics: Bilateral - Description - Normal. Tenderness - Non Tender.    Assessment & Plan SCLEROSING ADENOSIS OF BREAST, RIGHT (N60.21) Impression: The patient appears to have 2 abnormalities in the right breast. They are located in the outer and upper outer quadrant of the right breast. One was a complex sclerosing lesion. The second was fibrocystic disease but the result was felt to be discordant. Because  of their abnormal appearance I would recommend that both of these areas be removed. She would require to right breast radioactive seed localized lumpectomies. I have discussed with her in detail the risks and benefits of the operation to do this as well as some of the technical aspects and she understands and wishes to proceed. She would also like to have the mole removed at the same time. Current Plans Referred to Oncology, for evaluation and follow up (Oncology). Routine. Pt Education - Breast Diseases: discussed with patient and provided information.

## 2016-03-12 NOTE — Anesthesia Postprocedure Evaluation (Signed)
Anesthesia Post Note  Patient: Janet Wells  Procedure(s) Performed: Procedure(s) (LRB): RIGHT BREAST LUMPECTOMY WITH RADIOACTIVE SEED LOCALIZATION x2, Removal Right Breast Mole (Right)  Patient location during evaluation: PACU Anesthesia Type: General Level of consciousness: sedated and patient cooperative Pain management: pain level controlled Vital Signs Assessment: post-procedure vital signs reviewed and stable Respiratory status: spontaneous breathing Cardiovascular status: stable Anesthetic complications: no       Last Vitals:  Vitals:   03/12/16 1230 03/12/16 1301  BP: 123/76 133/71  Pulse: 72 66  Resp: 13 18  Temp:  36.3 C    Last Pain:  Vitals:   03/12/16 1301  PainSc: [place]

## 2016-03-12 NOTE — Interval H&P Note (Signed)
History and Physical Interval Note:  03/12/2016 10:32 AM  Janet Wells  has presented today for surgery, with the diagnosis of RIGHT BREAST COMPLEX SCLEROSING LESION AND DISCORDANCE  The various methods of treatment have been discussed with the patient and family. After consideration of risks, benefits and other options for treatment, the patient has consented to  Procedure(s): RIGHT BREAST LUMPECTOMY WITH RADIOACTIVE SEED LOCALIZATION x2 (Right) as a surgical intervention .  The patient's history has been reviewed, patient examined, no change in status, stable for surgery.  I have reviewed the patient's chart and labs.  Questions were answered to the patient's satisfaction.     TOTH III,Abrahan Fulmore S

## 2016-03-13 ENCOUNTER — Encounter (HOSPITAL_BASED_OUTPATIENT_CLINIC_OR_DEPARTMENT_OTHER): Payer: Self-pay | Admitting: General Surgery

## 2016-04-06 ENCOUNTER — Telehealth: Payer: Self-pay | Admitting: Hematology

## 2016-04-06 ENCOUNTER — Encounter: Payer: Self-pay | Admitting: Hematology

## 2016-04-06 NOTE — Telephone Encounter (Signed)
Appt has been scheduled for the pt to see Dr. Burr Medico on 2/26 at 11am. Aware to arrive 30 minutes early. Demographics verified. Letter mailed.

## 2016-04-13 NOTE — Progress Notes (Signed)
[place]  Telephone:([phone] Fax:([phone]) [phone]  Clinic New Consult Note   Patient Care Team: No Pcp Per Patient as PCP - General (General Practice) W Evette Cristal, MD as Consulting Physician (Obstetrics and Gynecology) 04/16/2016  REFERRAL PHYSICIAN: Dr. 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 She was referred by her breast surgeon Dr. Marlou Starks to our high risk breast clinic to discuss breast cancer risk reduction. She presents to my clinic by herself today.  She had a diagnostic mammogram on 01/16/2016 and a suspicious 5 x 4 x 4 mm mass in the 8:30 position of the right breast was found and a biopsy was done. 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 Pt had a right breast lumpectomy by Dr. Marlou Starks on 03/12/2016 for this lesion. A mole on the upper portion of the right breast was also removed and was found to be benign. She presents for further management.   She is doing well today. She did not feel the lump in her breast. She gets mammograms every year. Last year her left breast had a cyst, but that deflated and went away. She has recovered well from surgery. There is still some aching in her right breast, from surgery. Denies any other concerns.   She has a history of acid reflux, arthritis, and depression. She takes alprazolam PRN. After her hysterectomy, she didn't have any hot flashes. No family history of breast or ovarian cancer that she knows of. Her maternal grandmother had colon cancer in her late 9's. Never smoker. Occasional drinker. She has tramadol, but doesn't take it. She is on Estradiol, which she started after her hysterectomy.  GYN HISTORY    Menarchal: 13  LMP: hysterectomy march 2017 Contraceptive: none HRT: yes since March 2017 GP: 2 children, age 88 for first child  MEDICAL HISTORY:  Past Medical History:  Diagnosis Date  . Anxiety   . Arthritis    hands, fingers and back  . Breast mass, right   . Complication of anesthesia   . Depression   . GERD (gastroesophageal reflux disease)   . Headache    hx migraines  . Meniscus tear    Janet knee  . PONV (postoperative nausea and vomiting)   . Ruptured lumbar disc     SURGICAL HISTORY: Past Surgical History:  Procedure Laterality Date  . ABDOMINAL HYSTERECTOMY    . BREAST LUMPECTOMY WITH RADIOACTIVE SEED LOCALIZATION Right 03/12/2016   Procedure: RIGHT BREAST LUMPECTOMY WITH RADIOACTIVE SEED LOCALIZATION x2, Removal Right Breast Mole;  Surgeon: Autumn Messing III, MD;  Location: [place];  Service: General;  Laterality: Right;  . CESAREAN SECTION    . COLONOSCOPY    . DILATION AND CURETTAGE OF UTERUS     x3  . KNEE ARTHROSCOPY WITH MEDIAL MENISECTOMY Left 02/26/2014   Procedure: LEFT KNEE ARTHROSCOPY WITH ABRASION CHONDROPLASTY OF THE PATELLA  AND MEDIAL FEMORAL CHONDYLE MICROFRACTURE TECHNIQUE;  Surgeon: Tobi Bastos, MD;  Location: [facility];  Service: Orthopedics;  Laterality: Left;  . LAPAROSCOPY     for endometriosis  . RHINOPLASTY  1980's  . TONSILLECTOMY      SOCIAL HISTORY: Social History   Social History  . Marital status: Married  Spouse name: N/A  . Number of children: N/A  . Years of education: N/A   Occupational History  . Not on file.   Social History Main Topics  . Smoking status: Never Smoker  . Smokeless tobacco: Never Used  . Alcohol use Yes     Comment: social  . Drug use: No  . Sexual activity: Not on file   Other Topics Concern  . Not on file   Social History Narrative  . No narrative on file    FAMILY HISTORY: Family History  Problem Relation Age of Onset  . Cancer Maternal Aunt 60    colon cancer      ALLERGIES:  is allergic to codeine and vicodin [hydrocodone-acetaminophen].  MEDICATIONS:  Current Outpatient Prescriptions  Medication Sig Dispense Refill  . ALPRAZolam (XANAX) 0.25 MG tablet Take 0.25 mg by mouth at bedtime as needed for anxiety.    Marland Kitchen estradiol (ESTRACE) 2 MG tablet Take 2 mg by mouth daily.    . Multiple Vitamin (MULTIVITAMIN WITH MINERALS) TABS tablet Take 1 tablet by mouth daily.    . traMADol (ULTRAM) 50 MG tablet Take 1-2 tablets (50-100 mg total) by mouth every 6 (six) hours as needed. (Patient not taking: Reported on 04/16/2016) 30 tablet 1   No current facility-administered medications for this visit.     REVIEW OF SYSTEMS:   Constitutional: Denies fevers, chills or abnormal night sweats (+) aching right breast Eyes: Denies blurriness of vision, double vision or watery eyes Ears, nose, mouth, throat, and face: Denies mucositis or sore throat Respiratory: Denies cough, dyspnea or wheezes Cardiovascular: Denies palpitation, chest discomfort or lower extremity swelling Gastrointestinal:  Denies nausea, heartburn or change in bowel habits Skin: Denies abnormal skin rashes Lymphatics: Denies new lymphadenopathy or easy bruising Neurological:Denies numbness, tingling or new weaknesses Behavioral/Psych: Mood is stable, no new changes  All other systems were reviewed with the patient and are negative.  PHYSICAL EXAMINATION: ECOG PERFORMANCE STATUS: 0  Vitals:   04/16/16 1127  BP: (!) 148/65  Pulse: 79  Resp: 17  Temp: 98.8 F (37.1 C)   Filed Weights   04/16/16 1127  Weight: 209 lb 9.6 oz (95.1 kg)   GENERAL:alert, no distress and comfortable SKIN: skin color, texture, turgor are normal, no rashes or significant lesions EYES: normal, conjunctiva are pink and non-injected, sclera clear OROPHARYNX:no exudate, no erythema and lips, buccal mucosa, and tongue normal  NECK: supple, thyroid normal size, non-tender, without nodularity LYMPH:  no palpable  lymphadenopathy in the cervical, axillary or inguinal LUNGS: clear to auscultation and percussion with normal breathing effort HEART: regular rate & rhythm and no murmurs and no lower extremity edema ABDOMEN:abdomen soft, non-tender and normal bowel sounds Musculoskeletal:no cyanosis of digits and no clubbing  PSYCH: alert & oriented x 3 with fluent speech NEURO: no focal motor/sensory deficits Breasts: Breast inspection showed them to be symmetrical with no nipple discharge. Palpation of the breasts and axilla revealed no obvious mass that I could appreciate. (+) s/p right lumpectomy. Incision site healing well. Some swollen and scar tissue present.  LABORATORY DATA:  I have reviewed the data as listed CBC Latest Ref Rng & Units 02/22/2014  WBC 4.0 - 10.5 K/uL 4.4  Hemoglobin 12.0 - 15.0 g/dL 11.4(Janet)  Hematocrit 36.0 - 46.0 % 36.9  Platelets 150 - 400 K/uL 256   No flowsheet data found.   PATHOLOGY:  Diagnosis 03/12/2016 1. Breast, lumpectomy, Right - FIBROCYSTIC CHANGES WITH USUAL DUCTAL HYPERPLASIA - NO EVIDENCE OF  MALIGNANCY. - PREVIOUS BIOPSY SITE. 2. Skin , Right breast mole - MELANOCYTIC NEVUS. - MARGINS NOT INVOLVED. 3. Breast, lumpectomy, Right breast w/seed - COMPLEX SCLEROSING LESION WITH USUAL DUCTAL HYPERPLASIA. - FIBROCYSTIC CHANGES WITH USUAL DUCTAL HYPERPLASIA. - NO EVIDENCE OF MALIGNANCY. - PREVIOUS BIOPSY SITE.  Diagnosis 01/17/2016 Breast, right, needle core biopsy, UOQ - FIBROCYSTIC CHANGES WITH USUAL DUCTAL HYPERPLASIA. - NO EVIDENCE OF MALIGNANCY.  Diagnosis 01/16/2016 Breast, right, needle core biopsy, LOQ, 8:30 o'clock, 5 cmfn COMPLEX SCLEROSING LESION WITH USUAL DUCTAL HYPERPLASIA  RADIOGRAPHIC STUDIES: I have personally reviewed the radiological images as listed and agreed with the findings in the report.  Diagnostic Mammogram R breast 01/16/2016 IMPRESSION: Suspicious 5 x 4 x 4 mm mass in the 8:30 position of the right breast.  ASSESSMENT &  PLAN:  Ms. Crus is a 61 y.o. post-menopausal female with:  1. 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  -I used Baker Janus model to estimate her risk of breast cancer, and her risk is 3% in the next 5 years, which is slightly higher than average risk of 1.7%, but she has overall low risk for breast cancer.    -We also discussed other risks of breast cancer, such as obesity, postmenopausal hormonal replacement, etc. I strongly encouraged her to discontinue estradiol basement, she will discuss with her oncologist Dr. Nori Riis and gradually wean off in the next month.  -I strongly encouraged her to have healthy diet, and exercise regularly, and try to lose some weight.  -I have discussed breast cancer surveillance with her; yearly mammograms, self breast exams and routine office visit with breast exam  -I have encouraged her to eat healthy and exercise regularly. .   2. Genetic -maternal grandmother had colon cancer in her late 88's. -She gets frequent colonoscopies.  -No family history of breast or ovarian cancer   Plan -I will copy my note with her gynecologist Dr. Nori Riis -Stop taking Estradiol, wean it off in the next month  -f/u with Dr.Neal -I will only see her as needed in the future.   All questions were answered. The patient knows to call the clinic with any problems, questions or concerns.  I spent 40 minutes counseling the patient face to face. The total time spent in the appointment was 50 minutes and more than 50% was on counseling.  This document serves as a record of services personally performed by Truitt Merle, MD. It was created on her behalf by Martinique Casey, a trained medical scribe. The creation of this record is based on the scribe's personal  observations and the provider's statements to them. This document has been checked and approved by the attending provider.  I have reviewed the above documentation for accuracy and completeness and I agree with the above.    Truitt Merle, MD 04/16/2016

## 2016-04-16 ENCOUNTER — Encounter: Payer: Self-pay | Admitting: Hematology

## 2016-04-16 ENCOUNTER — Encounter (INDEPENDENT_AMBULATORY_CARE_PROVIDER_SITE_OTHER): Payer: Self-pay

## 2016-04-16 ENCOUNTER — Ambulatory Visit (HOSPITAL_BASED_OUTPATIENT_CLINIC_OR_DEPARTMENT_OTHER): Payer: Managed Care, Other (non HMO) | Admitting: Hematology

## 2016-04-16 DIAGNOSIS — N6099 Unspecified benign mammary dysplasia of unspecified breast: Secondary | ICD-10-CM | POA: Insufficient documentation

## 2016-04-16 DIAGNOSIS — N6091 Unspecified benign mammary dysplasia of right breast: Secondary | ICD-10-CM

## 2019-09-29 ENCOUNTER — Other Ambulatory Visit: Payer: Self-pay | Admitting: Obstetrics & Gynecology

## 2019-09-29 DIAGNOSIS — R928 Other abnormal and inconclusive findings on diagnostic imaging of breast: Secondary | ICD-10-CM

## 2019-10-09 ENCOUNTER — Ambulatory Visit
Admission: RE | Admit: 2019-10-09 | Discharge: 2019-10-09 | Disposition: A | Discharge status: Home / Self Care | Payer: Managed Care, Other (non HMO) | Source: Ambulatory Visit | Attending: Obstetrics & Gynecology | Admitting: Obstetrics & Gynecology

## 2019-10-09 ENCOUNTER — Ambulatory Visit: Payer: Managed Care, Other (non HMO)

## 2019-10-09 ENCOUNTER — Other Ambulatory Visit: Payer: Self-pay

## 2019-10-09 DIAGNOSIS — R928 Other abnormal and inconclusive findings on diagnostic imaging of breast: Secondary | ICD-10-CM

## 2020-10-16 IMAGING — MG MM DIGITAL DIAGNOSTIC UNILAT*L* W/ TOMO W/ CAD
8 series · 8 of 24 positions shown · non-contrast
Comparison: Previous exam(s).

CLINICAL DATA: Patient was called back from screening mammogram for
2 areas of possible distortion in the left breast.

EXAM:
DIGITAL DIAGNOSTIC UNILATERAL LEFT MAMMOGRAM WITH TOMO AND CAD

[L CC synth-2D (1 of 3)]
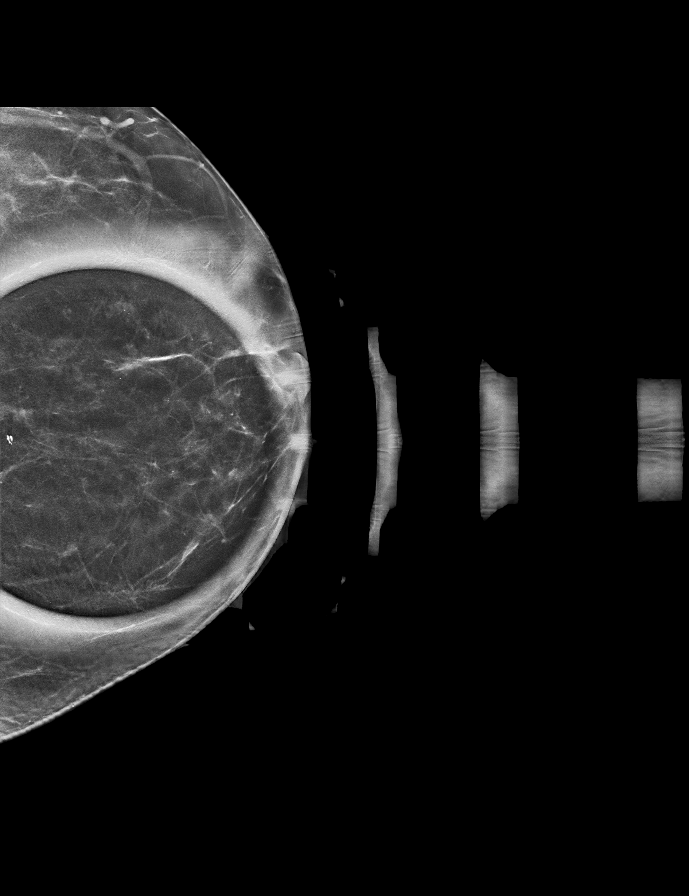

[L ML synth-2D]
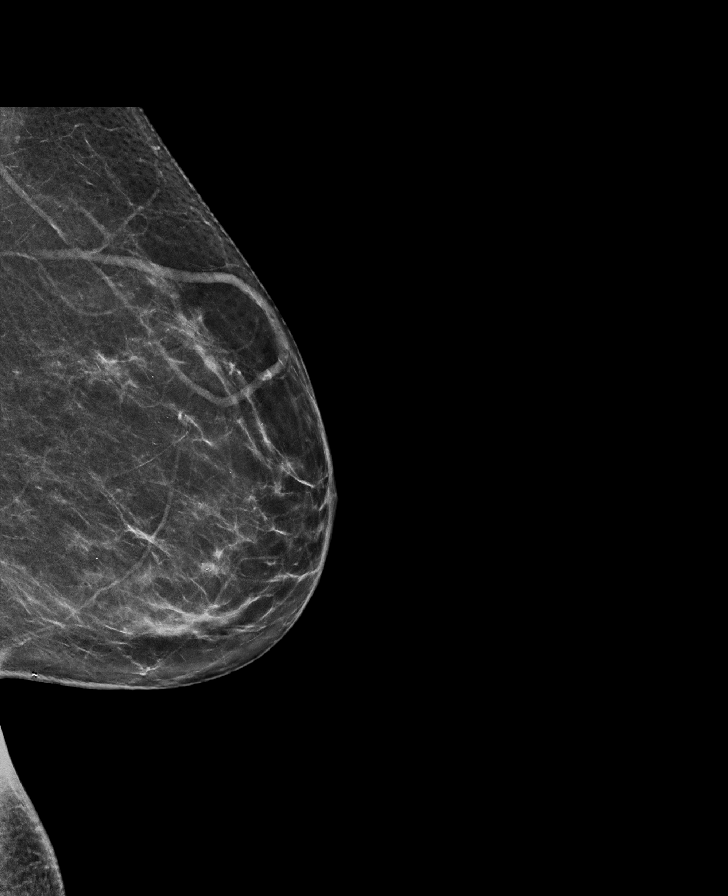

[L CC synth-2D (2 of 3)]
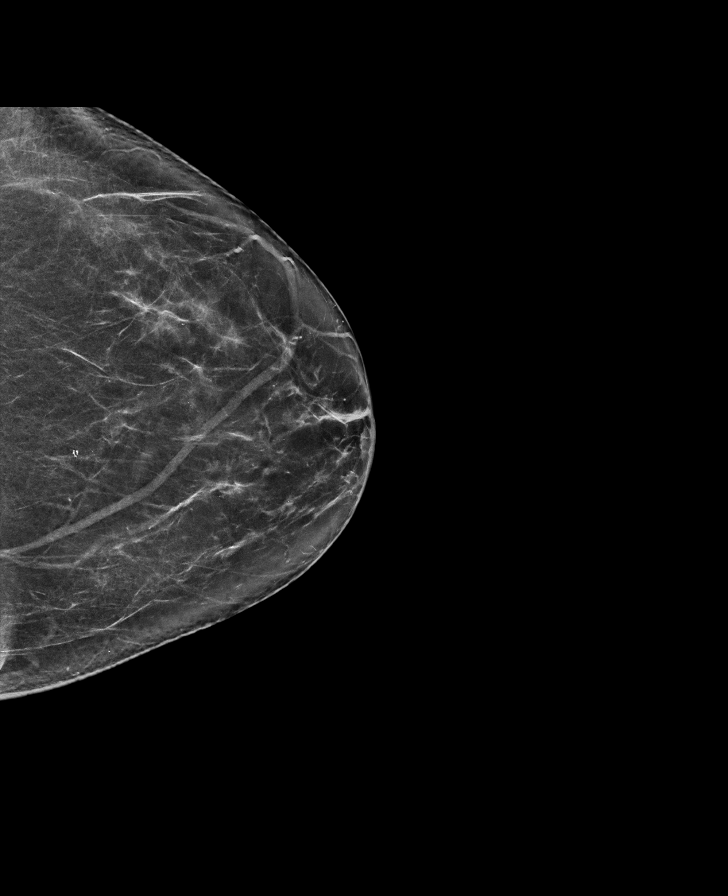

[L CC synth-2D (3 of 3)]
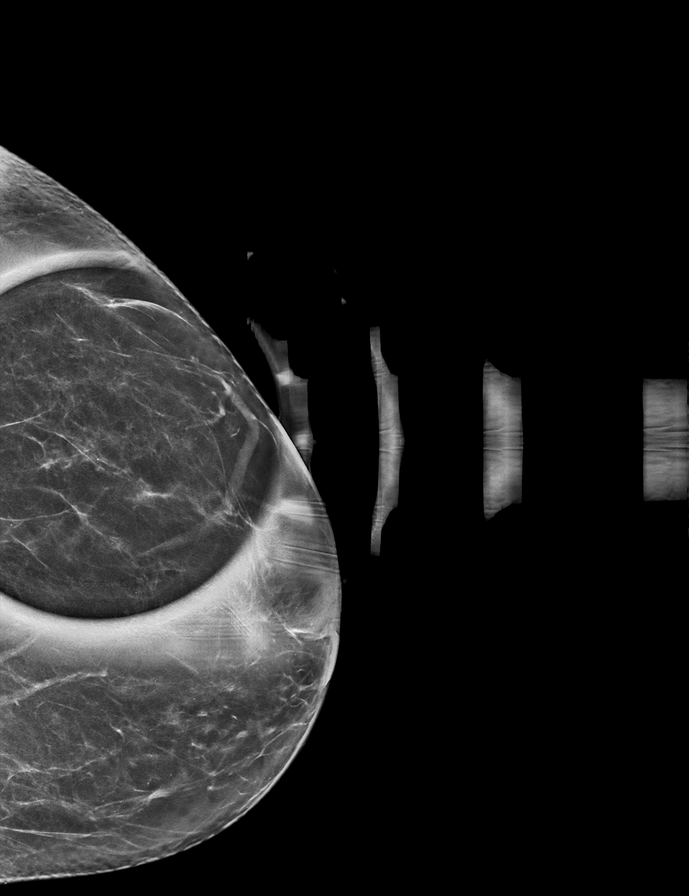

[L ML tomo · tomo slice 37/73.0]
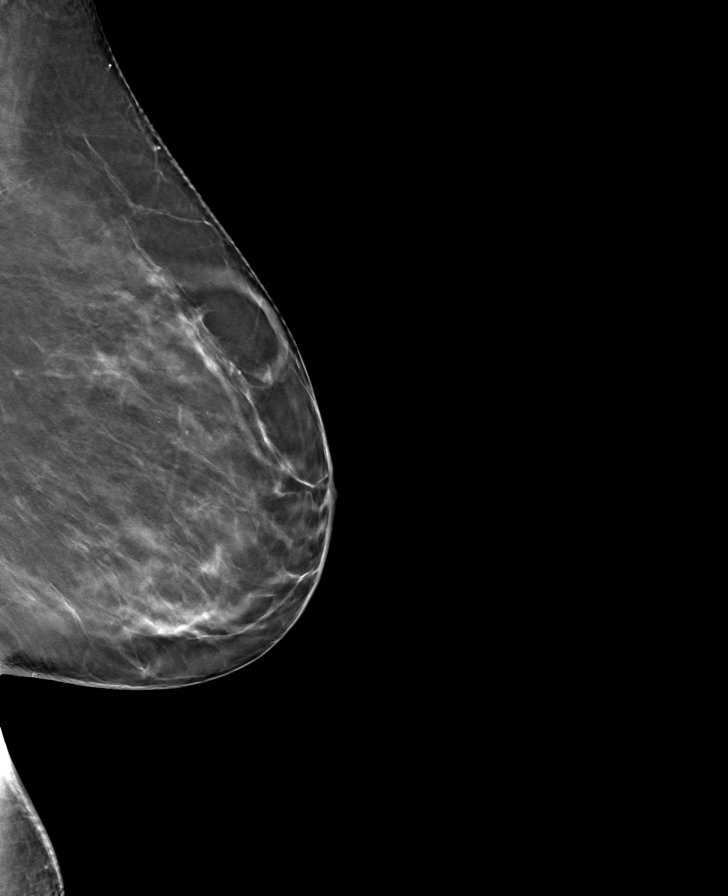

[L CC tomo (1 of 3) · tomo slice 31/60.0]
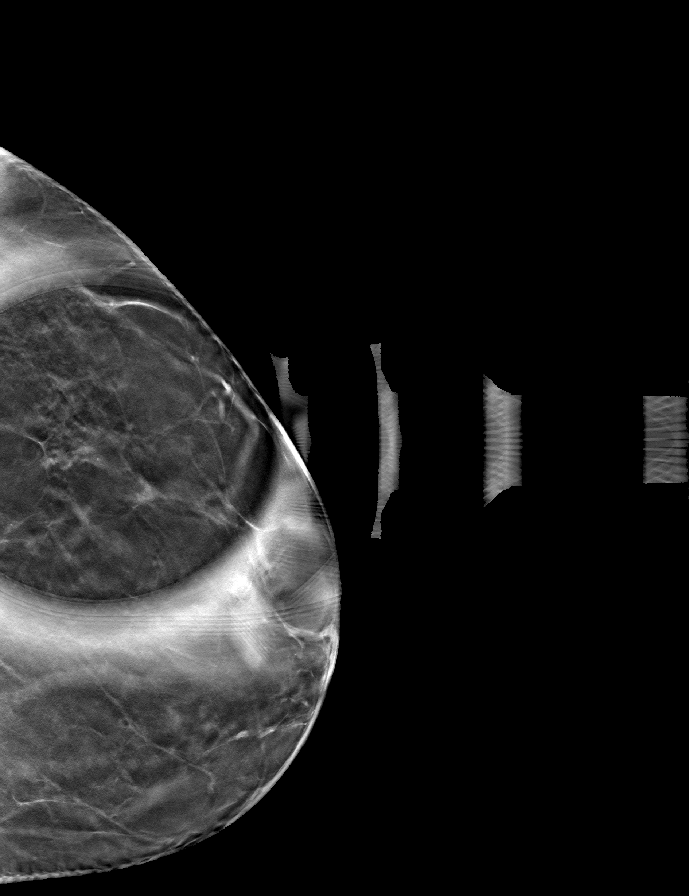

[L CC tomo (2 of 3) · tomo slice 27/54.0]
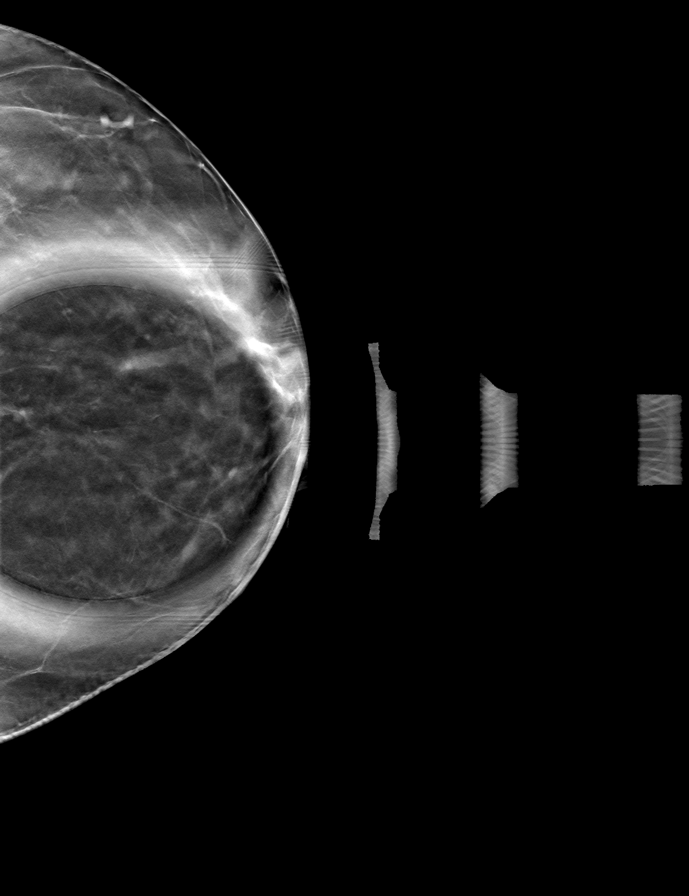

[L CC tomo (3 of 3) · tomo slice 37/74.0]
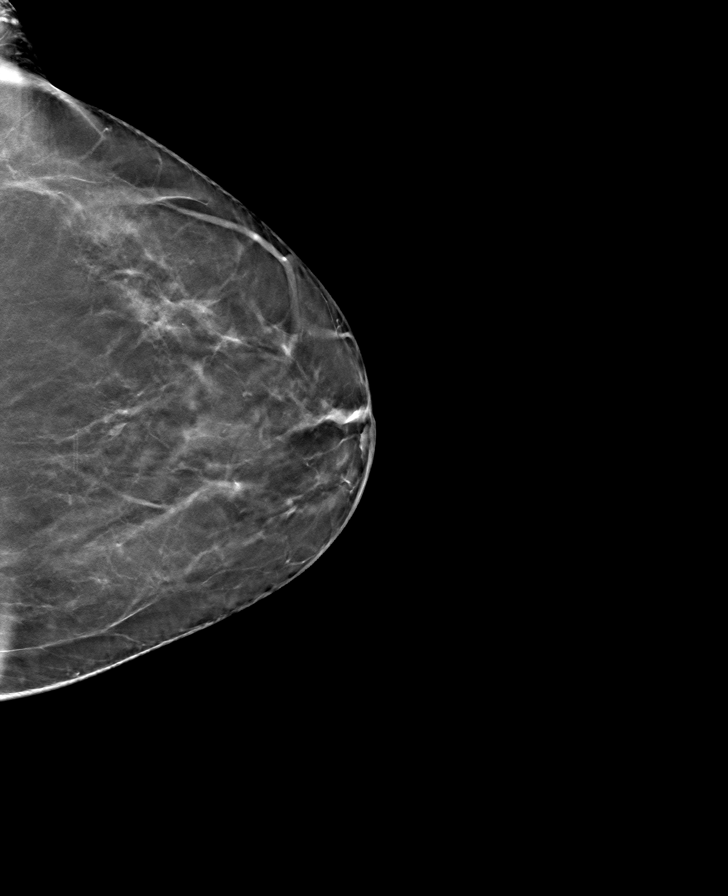

[8 of 24 positions shown; findings below may reference images not displayed]

ACR Breast Density Category b: There are scattered areas of
fibroglandular density.
FINDINGS: Additional imaging of the left breast was performed. No persistent
distortion, mass or malignant type microcalcifications identified.

Mammographic images were processed with CAD.
IMPRESSION: No evidence of malignancy in the left breast.

RECOMMENDATION:
Bilateral screening mammogram in 1 year is recommended.

I have discussed the findings and recommendations with the patient.
If applicable, a reminder letter will be sent to the patient
regarding the next appointment.

BI-RADS CATEGORY  1: Negative.
